# Patient Record
Sex: Male | Born: 1975 | Race: Black or African American | Hispanic: No | Marital: Married | Smoking: Current every day smoker
Health system: Southern US, Community
[De-identification: ages and names within clinical notes are randomized; demographics above are authoritative.]

## PROBLEM LIST (undated history)

## (undated) DIAGNOSIS — G43909 Migraine, unspecified, not intractable, without status migrainosus: Secondary | ICD-10-CM

## (undated) DIAGNOSIS — T7840XA Allergy, unspecified, initial encounter: Secondary | ICD-10-CM

## (undated) DIAGNOSIS — H269 Unspecified cataract: Secondary | ICD-10-CM

## (undated) HISTORY — DX: Unspecified cataract: H26.9

## (undated) HISTORY — DX: Allergy, unspecified, initial encounter: T78.40XA

---

## 1999-05-25 ENCOUNTER — Emergency Department (HOSPITAL_COMMUNITY): Admission: EM | Admit: 1999-05-25 | Discharge: 1999-05-25 | Payer: Self-pay | Admitting: Emergency Medicine

## 1999-05-25 ENCOUNTER — Encounter: Payer: Self-pay | Admitting: Emergency Medicine

## 2001-01-06 ENCOUNTER — Emergency Department (HOSPITAL_COMMUNITY): Admission: EM | Admit: 2001-01-06 | Discharge: 2001-01-06 | Payer: Self-pay | Admitting: Emergency Medicine

## 2007-03-07 ENCOUNTER — Emergency Department (HOSPITAL_COMMUNITY): Admission: EM | Admit: 2007-03-07 | Discharge: 2007-03-07 | Payer: Self-pay | Admitting: Emergency Medicine

## 2009-03-22 ENCOUNTER — Emergency Department (HOSPITAL_COMMUNITY): Admission: EM | Admit: 2009-03-22 | Discharge: 2009-03-22 | Payer: Self-pay | Admitting: Emergency Medicine

## 2012-07-26 ENCOUNTER — Encounter (HOSPITAL_BASED_OUTPATIENT_CLINIC_OR_DEPARTMENT_OTHER): Payer: Self-pay

## 2012-07-26 ENCOUNTER — Emergency Department (HOSPITAL_BASED_OUTPATIENT_CLINIC_OR_DEPARTMENT_OTHER)
Admission: EM | Admit: 2012-07-26 | Discharge: 2012-07-26 | Disposition: A | Discharge status: Home / Self Care | Payer: BC Managed Care – PPO | Attending: Emergency Medicine | Admitting: Emergency Medicine

## 2012-07-26 DIAGNOSIS — G43909 Migraine, unspecified, not intractable, without status migrainosus: Secondary | ICD-10-CM | POA: Insufficient documentation

## 2012-07-26 HISTORY — DX: Migraine, unspecified, not intractable, without status migrainosus: G43.909

## 2012-07-26 MED ORDER — METOCLOPRAMIDE HCL 5 MG/ML IJ SOLN
10.0000 mg | Freq: Once | INTRAMUSCULAR | Status: AC
  Administered 2012-07-26: 10 mg via INTRAMUSCULAR
  Filled 2012-07-26: qty 2

## 2012-07-26 MED ORDER — KETOROLAC TROMETHAMINE 60 MG/2ML IM SOLN
60.0000 mg | Freq: Once | INTRAMUSCULAR | Status: AC
  Administered 2012-07-26: 60 mg via INTRAMUSCULAR
  Filled 2012-07-26: qty 2

## 2012-07-26 MED ORDER — DIPHENHYDRAMINE HCL 50 MG/ML IJ SOLN
25.0000 mg | Freq: Once | INTRAMUSCULAR | Status: AC
  Administered 2012-07-26: 25 mg via INTRAMUSCULAR
  Filled 2012-07-26: qty 1

## 2012-07-26 MED ORDER — TRAMADOL HCL 50 MG PO TABS: 50.0000 mg | ORAL_TABLET | Freq: Four times a day (QID) | ORAL | Status: DC | PRN

## 2012-07-26 MED ORDER — SUMATRIPTAN SUCCINATE 100 MG PO TABS: 100.0000 mg | ORAL_TABLET | ORAL | Status: AC | PRN

## 2012-07-26 NOTE — ED Provider Notes (Signed)
History     CSN: [id number]  Arrival date & time 07/26/12  1546   First MD Initiated Contact with Patient 07/26/12 1600      Chief Complaint  Patient presents with  . Migraine    (Consider location/radiation/quality/duration/timing/severity/associated sxs/prior treatment) HPI Comments: Patient comes to the ER for evaluation of migraine headache. Patient has a previous history of migraines. He reports that he had achieved fairly good control previously with Topamax daily, but has been off of that for a couple of years. In the last few weeks has been having increased migraines. Patient has a throbbing headache that is consistent with previous migraines. No vomiting or diarrhea. Has some light sensitivity. No unusual features compared to previous migraines. Patient reports that he has followup with his doctor in one week.  Patient is a 37 y.o. male presenting with migraines.  Migraine Associated symptoms include headaches.    Past Medical History  Diagnosis Date  . Migraine     History reviewed. No pertinent past surgical history.  No family history on file.  History  Substance Use Topics  . Smoking status: Never Smoker   . Smokeless tobacco: Not on file  . Alcohol Use: No      Review of Systems  Neurological: Positive for headaches.  All other systems reviewed and are negative.    Allergies  Ibuprofen  Home Medications   Current Outpatient Rx  Name  Route  Sig  Dispense  Refill  . aspirin-acetaminophen-caffeine (EXCEDRIN MIGRAINE) 250-250-65 MG per tablet   Oral   Take 1 tablet by mouth every 6 (six) hours as needed for pain.           BP 140/73  Pulse 78  Temp(Src) 98.3 F (36.8 C) (Oral)  Resp 16  Ht 5\' 11"  (1.803 m)  Wt 210 lb (95.255 kg)  BMI 29.3 kg/m2  SpO2 100%  Physical Exam  Constitutional: He is oriented to person, place, and time. He appears well-developed and well-nourished. No distress.  HENT:  Head: Normocephalic and atraumatic.   Right Ear: Hearing normal.  Nose: Nose normal.  Mouth/Throat: Oropharynx is clear and moist and mucous membranes are normal.  Eyes: Conjunctivae and EOM are normal. Pupils are equal, round, and reactive to light.  Neck: Normal range of motion. Neck supple.  Cardiovascular: Normal rate, regular rhythm, S1 normal and S2 normal.  Exam reveals no gallop and no friction rub.   No murmur heard. Pulmonary/Chest: Effort normal and breath sounds normal. No respiratory distress. He exhibits no tenderness.  Abdominal: Soft. Normal appearance and bowel sounds are normal. There is no hepatosplenomegaly. There is no tenderness. There is no rebound, no guarding, no tenderness at McBurney's point and negative Murphy's sign. No hernia.  Musculoskeletal: Normal range of motion.  Neurological: He is alert and oriented to person, place, and time. He has normal strength. No cranial nerve deficit or sensory deficit. Coordination normal. GCS eye subscore is 4. GCS verbal subscore is 5. GCS motor subscore is 6.  Reflex Scores:      Tricep reflexes are 2+ on the right side and 2+ on the left side.      Bicep reflexes are 2+ on the right side and 2+ on the left side.      Patellar reflexes are 2+ on the right side and 2+ on the left side. Skin: Skin is warm, dry and intact. No rash noted. No cyanosis.  Psychiatric: He has a normal mood and affect. His speech is normal  and behavior is normal. Thought content normal.    ED Course  Procedures (including critical care time)  Labs Reviewed - No data to display No results found.   Diagnosis: Migraine headache    MDM  Patient presents to the ER for evaluation of migraine headache. Patient has a history of frequent chronic migraines, although he did achieve fairly good control in the past. He is out of his medications and now is having increased frequency of typical migraine type headaches. He has a normal neurologic examination. He does not require any imaging or  workup for his headache at this time. Patient treated for typical migraine and already has followup scheduled.        Gilda Crease, MD 07/26/12 [phone]

## 2012-07-26 NOTE — ED Notes (Signed)
C/o migraine HA every day x 1-5 weeks

## 2012-07-26 NOTE — ED Notes (Signed)
MD at bedside. 

## 2012-07-28 ENCOUNTER — Encounter (HOSPITAL_BASED_OUTPATIENT_CLINIC_OR_DEPARTMENT_OTHER): Payer: Self-pay

## 2012-07-28 DIAGNOSIS — Z7982 Long term (current) use of aspirin: Secondary | ICD-10-CM | POA: Insufficient documentation

## 2012-07-28 DIAGNOSIS — Z79899 Other long term (current) drug therapy: Secondary | ICD-10-CM | POA: Insufficient documentation

## 2012-07-28 DIAGNOSIS — R42 Dizziness and giddiness: Secondary | ICD-10-CM | POA: Insufficient documentation

## 2012-07-28 DIAGNOSIS — G43909 Migraine, unspecified, not intractable, without status migrainosus: Secondary | ICD-10-CM | POA: Insufficient documentation

## 2012-07-28 DIAGNOSIS — R220 Localized swelling, mass and lump, head: Secondary | ICD-10-CM | POA: Insufficient documentation

## 2012-07-28 NOTE — ED Notes (Signed)
Patient reports that he has ongoing intermittent headaches since being seen here on Thursday for same. Reports that he has been using meds with some relief but headache returns. Pain worse on left side of face/head

## 2012-07-29 ENCOUNTER — Emergency Department (HOSPITAL_BASED_OUTPATIENT_CLINIC_OR_DEPARTMENT_OTHER)
Admission: EM | Admit: 2012-07-29 | Discharge: 2012-07-29 | Disposition: A | Discharge status: Home / Self Care | Payer: BC Managed Care – PPO | Attending: Emergency Medicine | Admitting: Emergency Medicine

## 2012-07-29 MED ORDER — IBUPROFEN 600 MG PO TABS: 600.0000 mg | ORAL_TABLET | Freq: Four times a day (QID) | ORAL | Status: DC | PRN

## 2012-07-29 MED ORDER — DIAZEPAM 2 MG PO TABS
2.0000 mg | ORAL_TABLET | Freq: Once | ORAL | Status: AC
  Administered 2012-07-29: 2 mg via ORAL
  Filled 2012-07-29: qty 1

## 2012-07-29 MED ORDER — DEXAMETHASONE SODIUM PHOSPHATE 10 MG/ML IJ SOLN
10.0000 mg | Freq: Once | INTRAMUSCULAR | Status: AC
  Administered 2012-07-29: 10 mg via INTRAMUSCULAR
  Filled 2012-07-29: qty 1

## 2012-07-29 MED ORDER — HYDROCODONE-ACETAMINOPHEN 5-325 MG PO TABS: 1.0000 | ORAL_TABLET | Freq: Four times a day (QID) | ORAL | Status: DC | PRN

## 2012-07-29 MED ORDER — IBUPROFEN 200 MG PO TABS
600.0000 mg | ORAL_TABLET | Freq: Once | ORAL | Status: AC
  Administered 2012-07-29: 600 mg via ORAL
  Filled 2012-07-29: qty 1

## 2012-07-29 MED ORDER — HYDROCODONE-ACETAMINOPHEN 5-325 MG PO TABS
2.0000 | ORAL_TABLET | Freq: Once | ORAL | Status: AC
  Administered 2012-07-29: 2 via ORAL
  Filled 2012-07-29: qty 2

## 2012-07-29 NOTE — ED Provider Notes (Signed)
History     CSN: [id number]  Arrival date & time 07/28/12  2258   First MD Initiated Contact with Patient 07/29/12 [phone]      Chief Complaint  Patient presents with  . Migraine    (Consider location/radiation/quality/duration/timing/severity/associated sxs/prior treatment) HPI Comments:  Patient comes to the ER for evaluation of migraine headache. Patient has a previous history of migraines. He reports that he had achieved fairly good control previously with Topamax daily, but has been off of that for a couple of years. In the last few weeks has been having increased migraines. Patient has a throbbing headache that is consistent with previous migraines. No vomiting or diarrhea. Has some light sensitivity. No unusual features compared to previous migraines Patient was seen in the ED just few days back, and states that his headache responds for a little bit, and then just returns. He has a headache clinic visit on Tuesday. No nausea, vomiting, visual complains, seizures, altered mental status, loss of consciousness, new weakness, or numbness, no gait instability.  Patient is a 37 y.o. male presenting with migraines. The history is provided by the patient.  Migraine Associated symptoms include headaches. Pertinent negatives include no chest pain and no shortness of breath.    Past Medical History  Diagnosis Date  . Migraine     History reviewed. No pertinent past surgical history.  No family history on file.  History  Substance Use Topics  . Smoking status: Never Smoker   . Smokeless tobacco: Not on file  . Alcohol Use: No      Review of Systems  Constitutional: Negative for fever, chills and activity change.  HENT: Negative for neck pain.   Eyes: Negative for visual disturbance.  Respiratory: Negative for cough, chest tightness and shortness of breath.   Cardiovascular: Negative for chest pain.  Gastrointestinal: Negative for abdominal distention.  Genitourinary: Negative  for dysuria, enuresis and difficulty urinating.  Musculoskeletal: Negative for arthralgias.  Neurological: Positive for dizziness and headaches. Negative for light-headedness.  Psychiatric/Behavioral: Negative for confusion.    Allergies  Ibuprofen  Home Medications   Current Outpatient Rx  Name  Route  Sig  Dispense  Refill  . SUMAtriptan (IMITREX) 100 MG tablet   Oral   Take 1 tablet (100 mg total) by mouth every 2 (two) hours as needed for migraine (Do not take more than 2 tablets per 24hour period).   10 tablet   0   . traMADol (ULTRAM) 50 MG tablet   Oral   Take 1 tablet (50 mg total) by mouth every 6 (six) hours as needed for pain.   15 tablet   0   . aspirin-acetaminophen-caffeine (EXCEDRIN MIGRAINE) 250-250-65 MG per tablet   Oral   Take 1 tablet by mouth every 6 (six) hours as needed for pain.         Marland Kitchen HYDROcodone-acetaminophen (NORCO/VICODIN) 5-325 MG per tablet   Oral   Take 1 tablet by mouth every 6 (six) hours as needed for pain (for severe headaches only).   10 tablet   0   . ibuprofen (ADVIL,MOTRIN) 600 MG tablet   Oral   Take 1 tablet (600 mg total) by mouth every 6 (six) hours as needed for pain.   30 tablet   0     BP 120/77  Pulse 72  Temp(Src) 99.2 F (37.3 C) (Oral)  Resp 20  SpO2 99%  Physical Exam  Nursing note and vitals reviewed. Constitutional: He is oriented to person, place,  and time. He appears well-developed.  HENT:  Head: Normocephalic and atraumatic.  Mild right sided facial swelling ,with normal oral and ear exam, no trismus  Eyes: Conjunctivae and EOM are normal. Pupils are equal, round, and reactive to light.  Neck: Normal range of motion. Neck supple.  Cardiovascular: Normal rate and regular rhythm.   Pulmonary/Chest: Effort normal and breath sounds normal.  Abdominal: Soft. Bowel sounds are normal. He exhibits no distension. There is no tenderness. There is no rebound and no guarding.  Neurological: He is alert and  oriented to person, place, and time. No cranial nerve deficit. Coordination normal.  Skin: Skin is warm.    ED Course  Procedures (including critical care time)  Labs Reviewed - No data to display No results found.   1. Migraine syndrome       MDM  DDX includes: Primary headaches - including migrainous headaches, cluster headaches, tension headaches. ICH Carotid dissection Cavernous sinus thrombosis Meningitis Encephalitis Sinusitis Tumor Vascular headaches AV malformation Brain aneurysm Muscular headaches  A/P: Pt comes in with cc of headaches. No concerns for life threatening secondary headaches because of chronicity of the headaches, no meningeal signs, andno focal neurologic deficits.  Pt has been in the ED waiting for 5 hours, and pain has improved - so we gave po meds only, and added decadron. Pt's pain went down to 2/10 - and we d/c him. I requested him to continue with the imitrrex - and we are adding motrin to the regimen.  Tramadol is not helping, so instead we are giving 6 tabs of norco.   Derwood Kaplan, MD 07/29/12 [phone]

## 2014-09-10 ENCOUNTER — Encounter (HOSPITAL_COMMUNITY): Payer: Self-pay | Admitting: Emergency Medicine

## 2014-09-10 ENCOUNTER — Emergency Department (HOSPITAL_COMMUNITY)
Admission: EM | Admit: 2014-09-10 | Discharge: 2014-09-10 | Disposition: A | Discharge status: Home / Self Care | Payer: BLUE CROSS/BLUE SHIELD | Attending: Emergency Medicine | Admitting: Emergency Medicine

## 2014-09-10 DIAGNOSIS — R202 Paresthesia of skin: Secondary | ICD-10-CM | POA: Diagnosis not present

## 2014-09-10 DIAGNOSIS — J0110 Acute frontal sinusitis, unspecified: Secondary | ICD-10-CM | POA: Insufficient documentation

## 2014-09-10 DIAGNOSIS — R519 Headache, unspecified: Secondary | ICD-10-CM

## 2014-09-10 DIAGNOSIS — G43909 Migraine, unspecified, not intractable, without status migrainosus: Secondary | ICD-10-CM | POA: Insufficient documentation

## 2014-09-10 DIAGNOSIS — R51 Headache: Secondary | ICD-10-CM | POA: Diagnosis present

## 2014-09-10 MED ORDER — NAPROXEN 500 MG PO TABS: 500.0000 mg | ORAL_TABLET | Freq: Two times a day (BID) | ORAL | Status: DC

## 2014-09-10 MED ORDER — OXYMETAZOLINE HCL 0.05 % NA SOLN: 1.0000 | Freq: Two times a day (BID) | NASAL | Status: DC

## 2014-09-10 MED ORDER — NAPROXEN 250 MG PO TABS
500.0000 mg | ORAL_TABLET | Freq: Once | ORAL | Status: AC
  Administered 2014-09-10: 500 mg via ORAL
  Filled 2014-09-10: qty 2

## 2014-09-10 MED ORDER — AZITHROMYCIN 250 MG PO TABS: 250.0000 mg | ORAL_TABLET | Freq: Every day | ORAL | Status: DC

## 2014-09-10 MED ORDER — CETIRIZINE HCL 10 MG PO TABS: 10.0000 mg | ORAL_TABLET | Freq: Every day | ORAL | Status: DC

## 2014-09-10 NOTE — ED Provider Notes (Signed)
CSN: [id number]     Arrival date & time 09/10/14  2116 History   First MD Initiated Contact with Patient 09/10/14 2219     Chief Complaint  Patient presents with  . Headache  . Tingling     (Consider location/radiation/quality/duration/timing/severity/associated sxs/prior Treatment) HPI Comments: The patient is a 39 year old male who presents with a complaint of a frontal headache that started around 6:00 this evening, this was 4-1/2 hours ago, it was gradual in onset, it has been persistent, mild to moderate, associated with tingling in both upper extremities which is mild and intermittent. He denies weakness, there is no symptoms in the legs, denies fevers chills nausea vomiting photophobia cough sinus pain or tenderness nasal drainage or congestion. This does not feel like his typical headaches, he has generalized fatigue as well as evening. He denies stiffness of the neck loss of consciousness blurred vision or double vision.  Patient is a 39 y.o. male presenting with headaches. The history is provided by the patient.  Headache   Past Medical History  Diagnosis Date  . Migraine    History reviewed. No pertinent past surgical history. No family history on file. History  Substance Use Topics  . Smoking status: Never Smoker   . Smokeless tobacco: Not on file  . Alcohol Use: No    Review of Systems  Neurological: Positive for headaches.  All other systems reviewed and are negative.     Allergies  Ibuprofen  Home Medications   Prior to Admission medications   Medication Sig Start Date End Date Taking? Authorizing Provider  aspirin-acetaminophen-caffeine (EXCEDRIN MIGRAINE) [phone] MG per tablet Take 1 tablet by mouth every 6 (six) hours as needed for pain.    Historical Provider, MD  azithromycin (ZITHROMAX) 250 MG tablet Take 1 tablet (250 mg total) by mouth daily. Take first 2 tablets together, then 1 every day until finished. 09/10/14   Eber Hong, MD  cetirizine  (ZYRTEC ALLERGY) 10 MG tablet Take 1 tablet (10 mg total) by mouth daily. 09/10/14   Eber Hong, MD  HYDROcodone-acetaminophen (NORCO/VICODIN) 5-325 MG per tablet Take 1 tablet by mouth every 6 (six) hours as needed for pain (for severe headaches only). 07/29/12   Derwood Kaplan, MD  ibuprofen (ADVIL,MOTRIN) 600 MG tablet Take 1 tablet (600 mg total) by mouth every 6 (six) hours as needed for pain. 07/29/12   Derwood Kaplan, MD  naproxen (NAPROSYN) 500 MG tablet Take 1 tablet (500 mg total) by mouth 2 (two) times daily with a meal. 09/10/14   Eber Hong, MD  oxymetazoline (AFRIN NASAL SPRAY) 0.05 % nasal spray Place 1 spray into both nostrils 2 (two) times daily. 09/10/14   Eber Hong, MD  SUMAtriptan (IMITREX) 100 MG tablet Take 1 tablet (100 mg total) by mouth every 2 (two) hours as needed for migraine (Do not take more than 2 tablets per 24hour period). 07/26/12   Gilda Crease, MD  traMADol (ULTRAM) 50 MG tablet Take 1 tablet (50 mg total) by mouth every 6 (six) hours as needed for pain. 07/26/12   Gilda Crease, MD   BP 111/73 mmHg  Pulse 94  Temp(Src) 100.4 F (38 C) (Oral)  Resp 14  Ht  (1.803 m)  Wt 215 lb (97.523 kg)  BMI 30.00 kg/m2  SpO2 98% Physical Exam  Constitutional: He appears well-developed and well-nourished. No distress.  HENT:  Head: Normocephalic and atraumatic.  Mouth/Throat: Oropharynx is clear and moist. No oropharyngeal exudate.  Swollen nasal turbinates bilaterally,  thick mucoid discharge in the bilateral nares, oropharynx clear and moist, no sinus tenderness, tympanic membranes visualized, mild erythema on the left, normal on the right  Eyes: Conjunctivae and EOM are normal. Pupils are equal, round, and reactive to light. Right eye exhibits no discharge. Left eye exhibits no discharge. No scleral icterus.  Neck: Normal range of motion. Neck supple. No JVD present. No thyromegaly present.  Cardiovascular: Normal rate, regular rhythm, normal heart  sounds and intact distal pulses.  Exam reveals no gallop and no friction rub.   No murmur heard. Pulmonary/Chest: Effort normal and breath sounds normal. No respiratory distress. He has no wheezes. He has no rales.  Abdominal: Soft. Bowel sounds are normal. He exhibits no distension and no mass. There is no tenderness.  Musculoskeletal: Normal range of motion. He exhibits no edema or tenderness.  Lymphadenopathy:    He has no cervical adenopathy.  Neurological: He is alert. Coordination normal.  Skin: Skin is warm and dry. No rash noted. No erythema.  Psychiatric: He has a normal mood and affect. His behavior is normal.  Nursing note and vitals reviewed.   ED Course  Procedures (including critical care time) Labs Review Labs Reviewed - No data to display  Imaging Review No results found.   MDM   Final diagnoses:  Acute frontal sinusitis, recurrence not specified  Sinus headache    Vital signs show low-grade fever at 100.4, no tachycardia, no stiffness of the neck, no lymphadenopathy, likely has a sinusitis, treated with medications as below, patient is aware of the indications for return and is in agreement.   Meds given in ED:  Medications  naproxen (NAPROSYN) tablet 500 mg (500 mg Oral Given 09/10/14 2247)    Discharge Medication List as of 09/10/2014 10:32 PM    START taking these medications   Details  azithromycin (ZITHROMAX) 250 MG tablet Take 1 tablet (250 mg total) by mouth daily. Take first 2 tablets together, then 1 every day until finished., Starting 09/10/2014, Until Discontinued, Print    cetirizine (ZYRTEC ALLERGY) 10 MG tablet Take 1 tablet (10 mg total) by mouth daily., Starting 09/10/2014, Until Discontinued, Print    naproxen (NAPROSYN) 500 MG tablet Take 1 tablet (500 mg total) by mouth 2 (two) times daily with a meal., Starting 09/10/2014, Until Discontinued, Print    oxymetazoline (AFRIN NASAL SPRAY) 0.05 % nasal spray Place 1 spray into both nostrils 2  (two) times daily., Starting 09/10/2014, Until Discontinued, Print            Eber HongBrian Markavious Micco, MD 09/11/14 [phone]

## 2014-09-10 NOTE — Discharge Instructions (Signed)
Please call your doctor for a followup appointment within 24-48 hours. When you talk to your doctor please let them know that you were seen in the emergency department and have them acquire all of your records so that they can discuss the findings with you and formulate a treatment plan to fully care for your new and ongoing problems. ° °

## 2014-09-10 NOTE — ED Notes (Signed)
Pt reports frontal headache that started at 6pm with tingling down both arms. Grip strengths equal. sts he feels sluggish, also c/o lightheaded. Hx of cluster headaches but sts this is different.

## 2016-09-23 ENCOUNTER — Emergency Department (HOSPITAL_COMMUNITY): Payer: BLUE CROSS/BLUE SHIELD

## 2016-09-23 ENCOUNTER — Emergency Department (HOSPITAL_COMMUNITY)
Admission: EM | Admit: 2016-09-23 | Discharge: 2016-09-23 | Disposition: A | Discharge status: Home / Self Care | Payer: BLUE CROSS/BLUE SHIELD | Attending: Emergency Medicine | Admitting: Emergency Medicine

## 2016-09-23 ENCOUNTER — Encounter (HOSPITAL_COMMUNITY): Payer: Self-pay | Admitting: *Deleted

## 2016-09-23 ENCOUNTER — Other Ambulatory Visit: Payer: Self-pay

## 2016-09-23 DIAGNOSIS — Z79899 Other long term (current) drug therapy: Secondary | ICD-10-CM | POA: Diagnosis not present

## 2016-09-23 DIAGNOSIS — R0789 Other chest pain: Secondary | ICD-10-CM | POA: Diagnosis not present

## 2016-09-23 LAB — CBC
HEMATOCRIT: 51.2 % (ref 39.0–52.0)
HEMOGLOBIN: 17.3 g/dL — AB (ref 13.0–17.0)
MCH: 29.5 pg (ref 26.0–34.0)
MCHC: 33.8 g/dL (ref 30.0–36.0)
MCV: 87.2 fL (ref 78.0–100.0)
PLATELETS: 263 10*3/uL (ref 150–400)
RBC: 5.87 MIL/uL — AB (ref 4.22–5.81)
RDW: 14.7 % (ref 11.5–15.5)
WBC: 4.9 10*3/uL (ref 4.0–10.5)

## 2016-09-23 LAB — BASIC METABOLIC PANEL
ANION GAP: 7 (ref 5–15)
BUN: 10 mg/dL (ref 6–20)
CALCIUM: 9.3 mg/dL (ref 8.9–10.3)
CO2: 26 mmol/L (ref 22–32)
Chloride: 106 mmol/L (ref 101–111)
Creatinine, Ser: 1.33 mg/dL — ABNORMAL HIGH (ref 0.61–1.24)
GFR calc non Af Amer: >60 mL/min (ref >60)
Glucose, Bld: 116 mg/dL — ABNORMAL HIGH (ref 65–99)
POTASSIUM: 4 mmol/L (ref 3.5–5.1)
Sodium: 139 mmol/L (ref 135–145)

## 2016-09-23 LAB — I-STAT TROPONIN, ED: Troponin i, poc: 0 ng/mL (ref 0.00–0.08)

## 2016-09-23 MED ORDER — PREDNISONE 20 MG PO TABS
60.0000 mg | ORAL_TABLET | ORAL | Status: AC
  Administered 2016-09-23: 60 mg via ORAL
  Filled 2016-09-23: qty 3

## 2016-09-23 MED ORDER — PREDNISONE 20 MG PO TABS: 40.0000 mg | ORAL_TABLET | Freq: Every day | ORAL | 0 refills | Status: AC

## 2016-09-23 NOTE — Discharge Instructions (Signed)
As discussed, your evaluation today has been largely reassuring.  But, it is important that you monitor your condition carefully, and do not hesitate to return to the ED if you develop new, or concerning changes in your condition. ? ?Otherwise, please follow-up with your physician for appropriate ongoing care. ? ?

## 2016-09-23 NOTE — ED Triage Notes (Signed)
Pt reports onset last night of mid chest pain, increases with movement. Denies recent cough or sob. No acute distress is noted at triage and ekg done.

## 2016-09-23 NOTE — ED Notes (Signed)
Returned from radiology. 

## 2016-09-23 NOTE — ED Provider Notes (Signed)
[facility]-EMERGENCY DEPT Provider Note   CSN: [id number] Arrival date & time: 09/23/16  1121     History   Chief Complaint Chief Complaint  Patient presents with  . Chest Pain    HPI John Webb is a 41 y.o. male.  HPI Patient presents with chest pain. The pain is sternal, sore, uncomfortable, worse with motion, though unpredictably so. No new dyspnea, fever, cough. Patient does have history of migraines, notes that he had a typical migraine earlier today, but this has improved following a dose of sumatriptan. Beyond the headache, no other notable health changes. Patient is actively in an exercise program, exercises daily, notes that he had a recent break in his program, but recently resumed working out. Per Today, with ongoing pain, discomfort, and no clear alleviating factors, the patient presents for evaluation.  Past Medical History:  Diagnosis Date  . Migraine     There are no active problems to display for this patient.   History reviewed. No pertinent surgical history.     Home Medications    Prior to Admission medications   Medication Sig Start Date End Date Taking? Authorizing Provider  SUMAtriptan 6 MG/0.5ML SOAJ Inject 0.5 mLs into the skin as needed. May repeat in 2 hours. MAX 2/24hours 08/23/16 08/23/17 Yes Historical Provider, MD  tetrahydrozoline-zinc (VISINE-AC) 0.05-0.25 % ophthalmic solution Place 1 drop into both eyes daily.   Yes Historical Provider, MD  verapamil (CALAN) 120 MG tablet Take 120 mg by mouth 3 (three) times daily. 08/10/16 08/10/17 Yes Historical Provider, MD  cetirizine (ZYRTEC ALLERGY) 10 MG tablet Take 1 tablet (10 mg total) by mouth daily. Patient not taking: Reported on 09/23/2016 09/10/14   Eber Hong, MD  HYDROcodone-acetaminophen (NORCO/VICODIN) 5-325 MG per tablet Take 1 tablet by mouth every 6 (six) hours as needed for pain (for severe headaches only). Patient not taking: Reported on 09/23/2016 07/29/12   Derwood Kaplan, MD  ibuprofen  (ADVIL,MOTRIN) 600 MG tablet Take 1 tablet (600 mg total) by mouth every 6 (six) hours as needed for pain. Patient not taking: Reported on 09/23/2016 07/29/12   Derwood Kaplan, MD  naproxen (NAPROSYN) 500 MG tablet Take 1 tablet (500 mg total) by mouth 2 (two) times daily with a meal. Patient not taking: Reported on 09/23/2016 09/10/14   Eber Hong, MD  oxymetazoline [facility] NASAL SPRAY) 0.05 % nasal spray Place 1 spray into both nostrils 2 (two) times daily. Patient not taking: Reported on 09/23/2016 09/10/14   Eber Hong, MD  SUMAtriptan (IMITREX) 100 MG tablet Take 1 tablet (100 mg total) by mouth every 2 (two) hours as needed for migraine (Do not take more than 2 tablets per 24hour period). Patient not taking: Reported on 09/23/2016 07/26/12   Gilda Crease, MD  traMADol (ULTRAM) 50 MG tablet Take 1 tablet (50 mg total) by mouth every 6 (six) hours as needed for pain. Patient not taking: Reported on 09/23/2016 07/26/12   Gilda Crease, MD    Family History History reviewed. No pertinent family history.  Social History Social History  Substance Use Topics  . Smoking status: Never Smoker  . Smokeless tobacco: Not on file  . Alcohol use No     Allergies   Ibuprofen   Review of Systems Review of Systems  Constitutional:       Per HPI, otherwise negative  HENT:       Per HPI, otherwise negative  Respiratory:       Per HPI, otherwise negative  Cardiovascular:  Per HPI, otherwise negative  Gastrointestinal: Negative for vomiting.  Endocrine:       Negative aside from HPI  Genitourinary:       Neg aside from HPI   Musculoskeletal:       Per HPI, otherwise negative  Skin: Negative.   Neurological: Negative for syncope.     Physical Exam Updated Vital Signs BP 121/79   Pulse 70   Temp 99 F (37.2 C)   Resp 16   Ht 5\' 11"  (1.803 m)   Wt 195 lb (88.5 kg)   SpO2 96%   BMI 27.20 kg/m   Physical Exam  Constitutional: He is oriented to person, place, and  time. He appears well-developed. No distress.  HENT:  Head: Normocephalic and atraumatic.  Eyes: Conjunctivae and EOM are normal.  Cardiovascular: Normal rate and regular rhythm.   Pulmonary/Chest: Effort normal. No stridor. No respiratory distress.    Abdominal: He exhibits no distension.  Musculoskeletal: He exhibits no edema.  Neurological: He is alert and oriented to person, place, and time.  Skin: Skin is warm and dry.  Psychiatric: He has a normal mood and affect.  Nursing note and vitals reviewed.    ED Treatments / Results  Labs (all labs ordered are listed, but only abnormal results are displayed) Labs Reviewed  BASIC METABOLIC PANEL - Abnormal; Notable for the following:       Result Value   Glucose, Bld 116 (*)    Creatinine, Ser 1.33 (*)    All other components within normal limits  CBC - Abnormal; Notable for the following:    RBC 5.87 (*)    Hemoglobin 17.3 (*)    All other components within normal limits  I-STAT TROPOININ, ED    EKG EKG has sinus rhythm, rate 81, unremarkable Radiology Dg Chest 2 View  Result Date: 09/23/2016 CLINICAL DATA:  Chest pain for 1 day. EXAM: CHEST  2 VIEW COMPARISON:  None. FINDINGS: The lungs are clear. Heart size is normal. No pneumothorax or pleural effusion. No bony abnormality. IMPRESSION: Negative chest. Electronically Signed   By: Drusilla Kannerhomas  Dalessio M.D.   On: 09/23/2016 12:16    Procedures Procedures (including critical care time)  Medications Ordered in ED Medications  predniSONE (DELTASONE) tablet 60 mg (not administered)     Initial Impression / Assessment and Plan / ED Course  I have reviewed the triage vital signs and the nursing notes.  Pertinent labs & imaging results that were available during my care of the patient were reviewed by me and considered in my medical decision making (see chart for details).  Well-appearing young male presents with ongoing chest pain. Patient has minimal risk profile for ACS,  has reassuring EKG, labs. Patient's physical exam, with tenderness to palpation of the sternum, and pain with range of motion of the shoulder suggests inflammatory etiology. Patient is not a candidate for NSAID therapy given his headaches, history of precipitation of severe headaches with this medication. Patient started on a course of steroids, discharged in stable condition with inflammatory chest pain   Final Clinical Impressions(s) / ED Diagnoses  Atypical chest pain   Gerhard Munchobert Alaijah Gibler, MD 09/23/16 1315

## 2017-06-18 DIAGNOSIS — H1013 Acute atopic conjunctivitis, bilateral: Secondary | ICD-10-CM | POA: Diagnosis not present

## 2017-07-06 DIAGNOSIS — H18213 Corneal edema secondary to contact lens, bilateral: Secondary | ICD-10-CM | POA: Diagnosis not present

## 2017-07-06 DIAGNOSIS — H16143 Punctate keratitis, bilateral: Secondary | ICD-10-CM | POA: Diagnosis not present

## 2017-07-06 DIAGNOSIS — H17821 Peripheral opacity of cornea, right eye: Secondary | ICD-10-CM | POA: Diagnosis not present

## 2017-08-14 DIAGNOSIS — G43009 Migraine without aura, not intractable, without status migrainosus: Secondary | ICD-10-CM | POA: Diagnosis not present

## 2017-08-14 DIAGNOSIS — F419 Anxiety disorder, unspecified: Secondary | ICD-10-CM | POA: Diagnosis not present

## 2017-08-14 DIAGNOSIS — G44009 Cluster headache syndrome, unspecified, not intractable: Secondary | ICD-10-CM | POA: Diagnosis not present

## 2017-12-19 IMAGING — DX DG CHEST 2V
2 series · 2 of 2 positions shown · non-contrast
Comparison: None.

CLINICAL DATA: Chest pain for 1 day.

EXAM:
CHEST  2 VIEW

[chest pa]
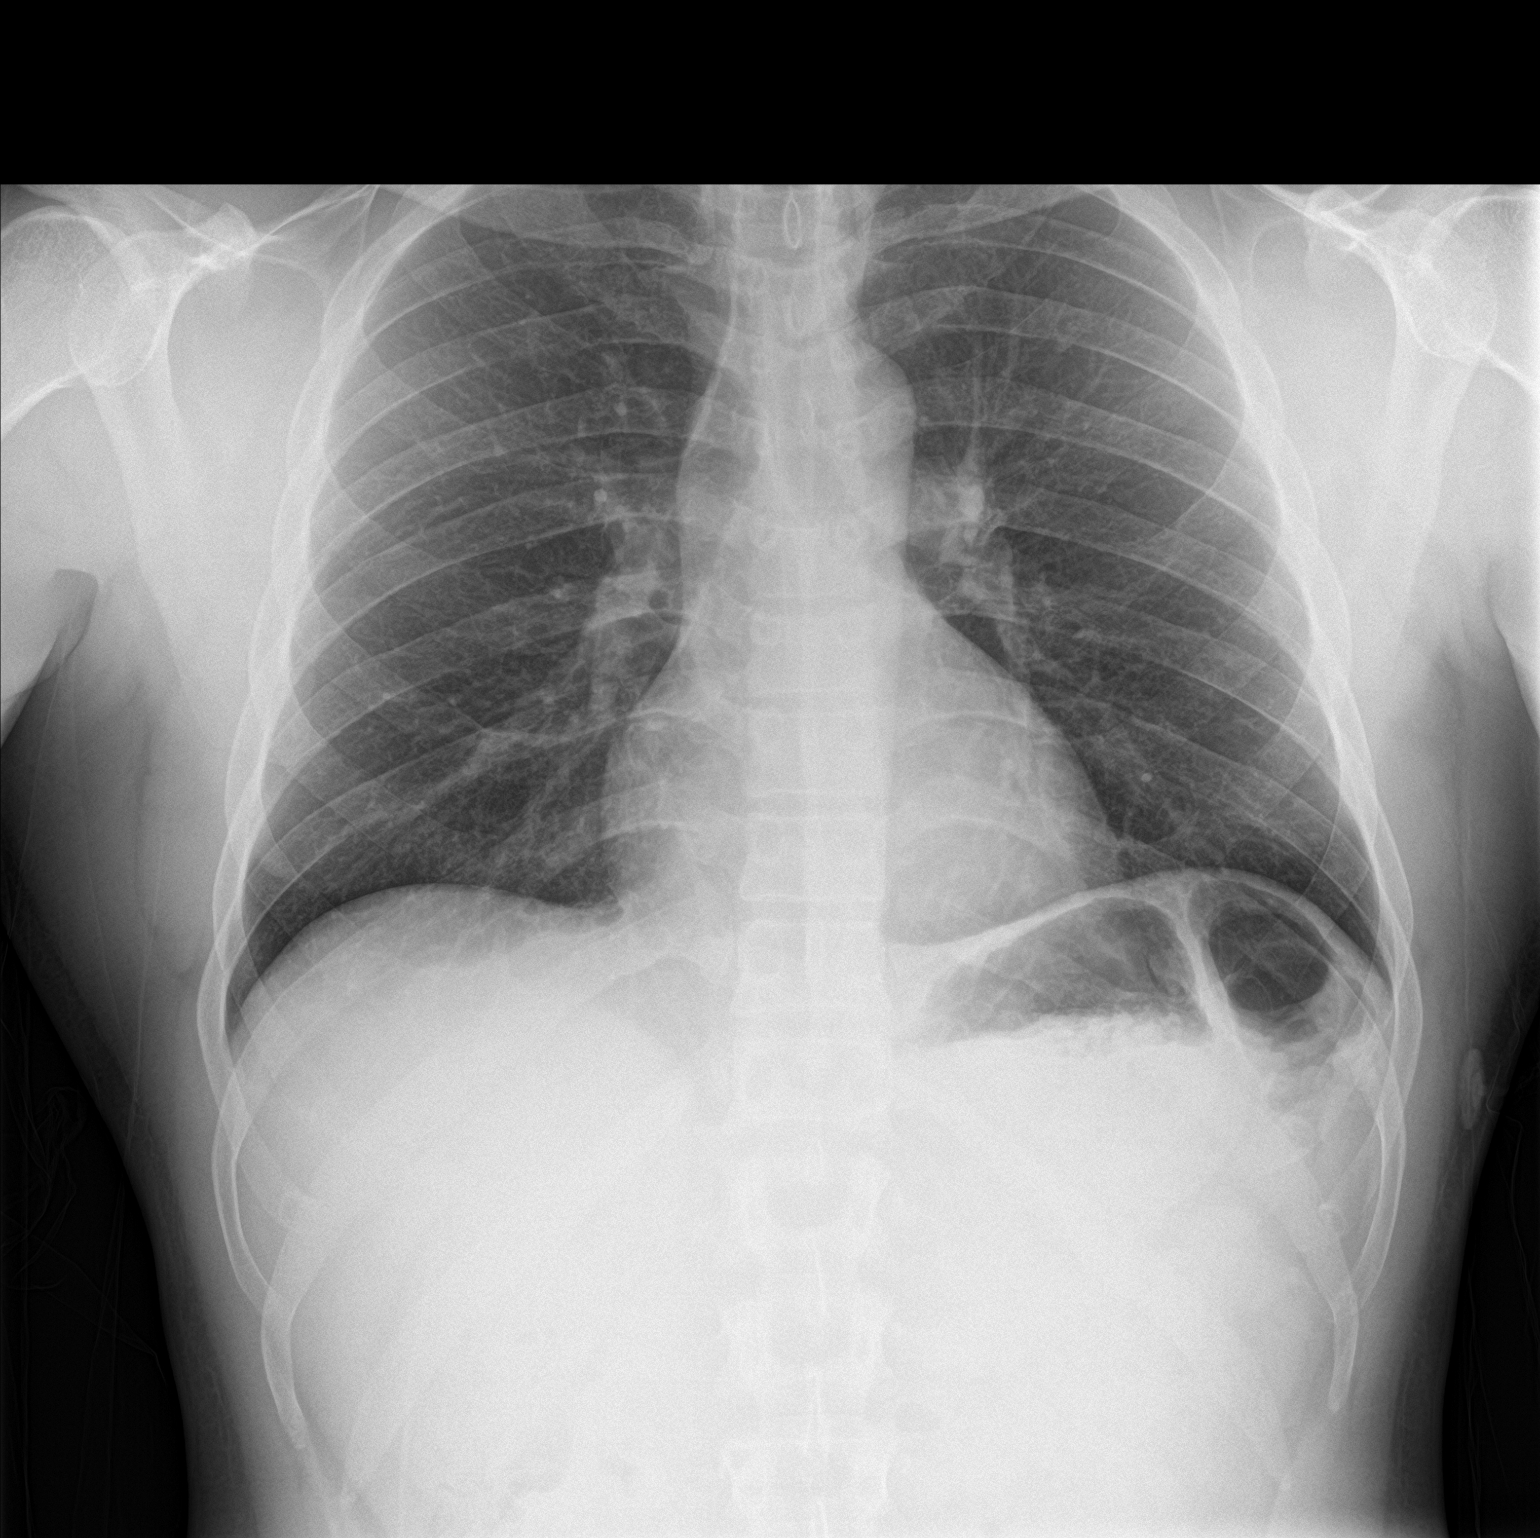

[chest lat]
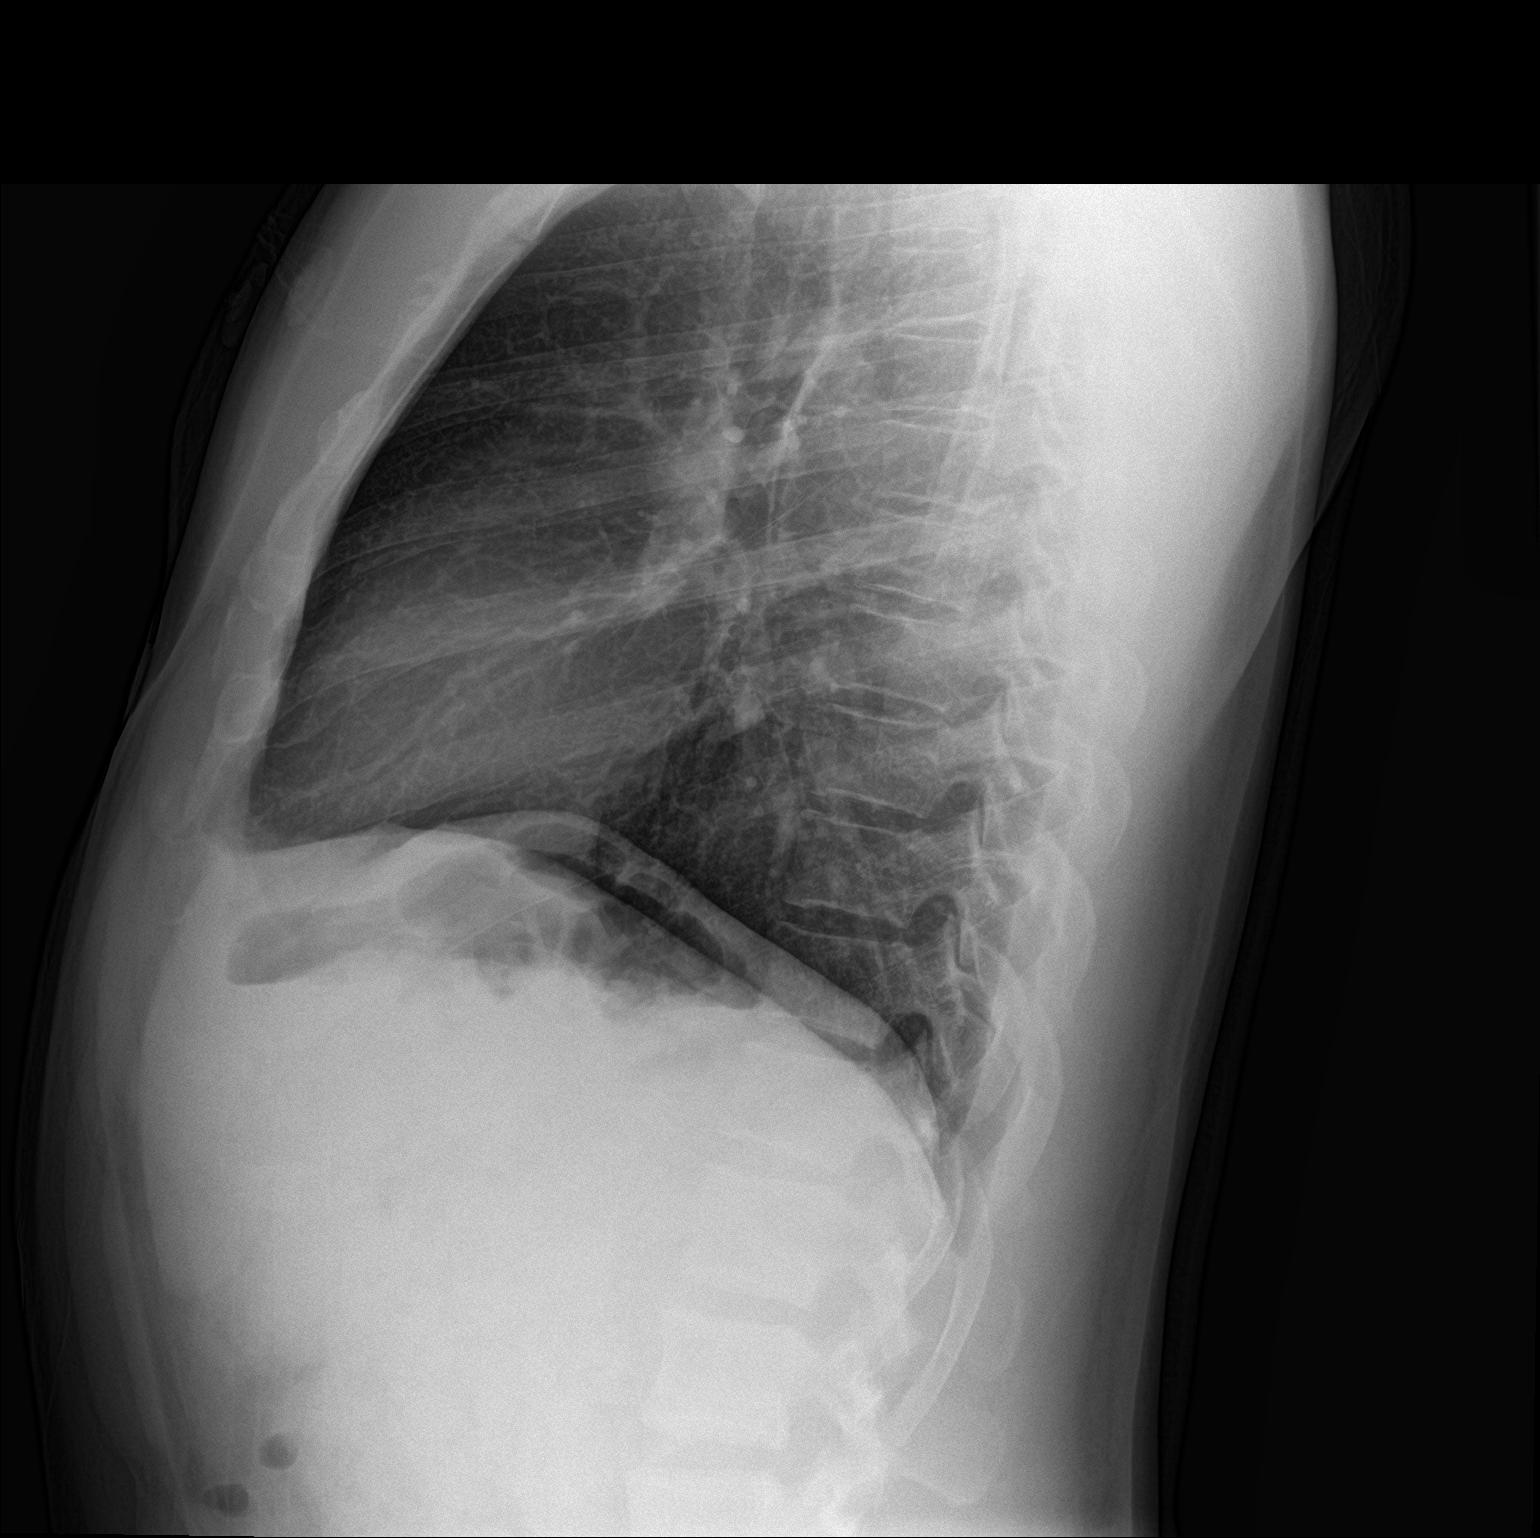

[2 of 2 positions shown; findings below may reference images not displayed]

FINDINGS: The lungs are clear. Heart size is normal. No pneumothorax or
pleural effusion. No bony abnormality.
IMPRESSION: Negative chest.

## 2018-01-02 DIAGNOSIS — M25571 Pain in right ankle and joints of right foot: Secondary | ICD-10-CM | POA: Diagnosis not present

## 2018-01-02 DIAGNOSIS — S93401A Sprain of unspecified ligament of right ankle, initial encounter: Secondary | ICD-10-CM | POA: Diagnosis not present

## 2018-03-12 DIAGNOSIS — G43009 Migraine without aura, not intractable, without status migrainosus: Secondary | ICD-10-CM | POA: Diagnosis not present

## 2018-03-12 DIAGNOSIS — G44009 Cluster headache syndrome, unspecified, not intractable: Secondary | ICD-10-CM | POA: Diagnosis not present

## 2019-05-06 ENCOUNTER — Encounter: Payer: Self-pay | Admitting: Family Medicine

## 2019-05-06 ENCOUNTER — Ambulatory Visit: Payer: BC Managed Care – PPO | Admitting: Family Medicine

## 2019-05-06 ENCOUNTER — Other Ambulatory Visit: Payer: Self-pay

## 2019-05-06 VITALS — BP 126/85 | HR 74 | Temp 98.3°F | Ht 70.0 in | Wt 192.2 lb

## 2019-05-06 DIAGNOSIS — Z130 Encounter for screening for diseases of the blood and blood-forming organs and certain disorders involving the immune mechanism: Secondary | ICD-10-CM | POA: Diagnosis not present

## 2019-05-06 DIAGNOSIS — Z0001 Encounter for general adult medical examination with abnormal findings: Secondary | ICD-10-CM | POA: Diagnosis not present

## 2019-05-06 DIAGNOSIS — Z0000 Encounter for general adult medical examination without abnormal findings: Secondary | ICD-10-CM

## 2019-05-06 DIAGNOSIS — Z1329 Encounter for screening for other suspected endocrine disorder: Secondary | ICD-10-CM

## 2019-05-06 DIAGNOSIS — Z13220 Encounter for screening for lipoid disorders: Secondary | ICD-10-CM

## 2019-05-06 DIAGNOSIS — Z131 Encounter for screening for diabetes mellitus: Secondary | ICD-10-CM

## 2019-05-06 DIAGNOSIS — F17290 Nicotine dependence, other tobacco product, uncomplicated: Secondary | ICD-10-CM

## 2019-05-06 NOTE — Patient Instructions (Addendum)
I'm glad to hear that headaches are doing ok. Keep follow up with headache specialist.   Let me know if any assistance needed to quit smoking.   1 year follow up physical unless any concerns sooner. Nice meeting you today and happy early birthday.   Keeping you healthy  Get these tests  Blood pressure- Have your blood pressure checked once a year by your healthcare provider.  Normal blood pressure is 120/80.  Weight- Have your body mass index (BMI) calculated to screen for obesity.  BMI is a measure of body fat based on height and weight. You can also calculate your own BMI at [email or web address]/.  Cholesterol- Have your cholesterol checked regularly starting at age 43, sooner may be necessary if you have diabetes, high blood pressure, if a family member developed heart diseases at an early age or if you smoke.   Chlamydia, HIV, and other sexual transmitted disease- Get screened each year until the age of 325 then within three months of each new sexual partner.  Diabetes- Have your blood sugar checked regularly if you have high blood pressure, high cholesterol, a family history of diabetes or if you are overweight.  Get these vaccines  Flu shot- Every fall.  Tetanus shot- Every 10 years.  Menactra- Single dose; prevents meningitis.  Take these steps  Don't smoke- If you do smoke, ask your healthcare provider about quitting. For tips on how to quit, go to www.smokefree.gov or call 1-800-QUIT-NOW.  Be physically active- Exercise 5 days a week for at least 30 minutes.  If you are not already physically active start slow and gradually work up to 30 minutes of moderate physical activity.  Examples of moderate activity include walking briskly, mowing the yard, dancing, swimming bicycling, etc.  Eat a healthy diet- Eat a variety of healthy foods such as fruits, vegetables, low fat milk, low fat cheese, yogurt, lean meats, poultry, fish, beans, tofu, etc.  For more information on  healthy eating, go to www.thenutritionsource.org  Drink alcohol in moderation- Limit alcohol intake two drinks or less a day.  Never drink and drive.  Dentist- Brush and floss teeth twice daily; visit your dentis twice a year.  Depression-Your emotional health is as important as your physical health.  If you're feeling down, losing interest in things you normally enjoy please talk with your healthcare provider.  Gun Safety- If you keep a gun in your home, keep it unloaded and with the safety lock on.  Bullets should be stored separately.  Helmet use- Always wear a helmet when riding a motorcycle, bicycle, rollerblading or skateboarding.  Safe sex- If you may be exposed to a sexually transmitted infection, use a condom  Seat belts- Seat bels can save your life; always wear one.  Smoke/Carbon Monoxide detectors- These detectors need to be installed on the appropriate level of your home.  Replace batteries at least once a year.  Skin Cancer- When out in the sun, cover up and use sunscreen SPF 15 or higher.  Violence- If anyone is threatening or hurting you, please tell your healthcare provider.  Steps to Quit Smoking Smoking tobacco is the leading cause of preventable death. It can affect almost every organ in the body. Smoking puts you and those around you at risk for developing many serious chronic diseases. Quitting smoking can be difficult, but it is one of the best things that you can do for your health. It is never too late to quit. How do I get ready to quit?  When you decide to quit smoking, create a plan to help you succeed. Before you quit:  Pick a date to quit. Set a date within the next 2 weeks to give you time to prepare.  Write down the reasons why you are quitting. Keep this list in places where you will see it often.  Tell your family, friends, and co-workers that you are quitting. Support from your loved ones can make quitting easier.  Talk with your health care provider  about your options for quitting smoking.  Find out what treatment options are covered by your health insurance.  Identify people, places, things, and activities that make you want to smoke (triggers). Avoid them. What first steps can I take to quit smoking?  Throw away all cigarettes at home, at work, and in your car.  Throw away smoking accessories, such as Set designer.  Clean your car. Make sure to empty the ashtray.  Clean your home, including curtains and carpets. What strategies can I use to quit smoking? Talk with your health care provider about combining strategies, such as taking medicines while you are also receiving in-person counseling. Using these two strategies together makes you more likely to succeed in quitting than if you used either strategy on its own.  If you are pregnant or breastfeeding, talk with your health care provider about finding counseling or other support strategies to quit smoking. Do not take medicine to help you quit smoking unless your health care provider tells you to do so. To quit smoking: Quit right away  Quit smoking completely, instead of gradually reducing how much you smoke over a period of time. Research shows that stopping smoking right away is more successful than gradually quitting.  Attend in-person counseling to help you build problem-solving skills. You are more likely to succeed in quitting if you attend counseling sessions regularly. Even short sessions of 10 minutes can be effective. Take medicine You may take medicines to help you quit smoking. Some medicines require a prescription and some you can purchase over-the-counter. Medicines may have nicotine in them to replace the nicotine in cigarettes. Medicines may:  Help to stop cravings.  Help to relieve withdrawal symptoms. Your health care provider may recommend:  Nicotine patches, gum, or lozenges.  Nicotine inhalers or sprays.  Non-nicotine medicine that is taken  by mouth. Find resources Find resources and support systems that can help you to quit smoking and remain smoke-free after you quit. These resources are most helpful when you use them often. They include:  Online chats with a Veterinary surgeon.  Telephone quitlines.  Printed Materials engineer.  Support groups or group counseling.  Text messaging programs.  Mobile phone apps or applications. Use apps that can help you stick to your quit plan by providing reminders, tips, and encouragement. There are many free apps for mobile devices as well as websites. Examples include Quit Guide from the [employer] and smokefree.gov What things can I do to make it easier to quit?   Reach out to your family and friends for support and encouragement. Call telephone quitlines (1-800-QUIT-NOW), reach out to support groups, or work with a counselor for support.  Ask people who smoke to avoid smoking around you.  Avoid places that trigger you to smoke, such as bars, parties, or smoke-break areas at work.  Spend time with people who do not smoke.  Lessen the stress in your life. Stress can be a smoking trigger for some people. To lessen stress, try: ? Exercising regularly. ?  Doing deep-breathing exercises. ? Doing yoga. ? Meditating. ? Performing a body scan. This involves closing your eyes, scanning your body from head to toe, and noticing which parts of your body are particularly tense. Try to relax the muscles in those areas. How will I feel when I quit smoking? Day 1 to 3 weeks Within the first 24 hours of quitting smoking, you may start to feel withdrawal symptoms. These symptoms are usually most noticeable 2-3 days after quitting, but they usually do not last for more than 2-3 weeks. You may experience these symptoms:  Mood swings.  Restlessness, anxiety, or irritability.  Trouble concentrating.  Dizziness.  Strong cravings for sugary foods and nicotine.  Mild weight  gain.  Constipation.  Nausea.  Coughing or a sore throat.  Changes in how the medicines that you take for unrelated issues work in your body.  Depression.  Trouble sleeping (insomnia). Week 3 and afterward After the first 2-3 weeks of quitting, you may start to notice more positive results, such as:  Improved sense of smell and taste.  Decreased coughing and sore throat.  Slower heart rate.  Lower blood pressure.  Clearer skin.  The ability to breathe more easily.  Fewer sick days. Quitting smoking can be very challenging. Do not get discouraged if you are not successful the first time. Some people need to make many attempts to quit before they achieve long-term success. Do your best to stick to your quit plan, and talk with your health care provider if you have any questions or concerns. Summary  Smoking tobacco is the leading cause of preventable death. Quitting smoking is one of the best things that you can do for your health.  When you decide to quit smoking, create a plan to help you succeed.  Quit smoking right away, not slowly over a period of time.  When you start quitting, seek help from your health care provider, family, or friends. This information is not intended to replace advice given to you by your health care provider. Make sure you discuss any questions you have with your health care provider. Document Released: 05/03/2001 Document Revised: 07/27/2018 Document Reviewed: 07/28/2018 Elsevier Patient Education  [employer].    If you have lab work done today you will be contacted with your lab results within the next 2 weeks.  If you have not heard from [place] then please contact us. The fastest way to get your results is to register for My Chart.   IF you received an x-ray today, you will receive an invoice from [facility]. Please contact [facility] at [phone] with questions or concerns regarding your invoice.   IF you  received labwork today, you will receive an invoice from [facility]. Please contact [facility] at [phone] with questions or concerns regarding your invoice.   Our billing staff will not be able to assist you with questions regarding bills from these companies.  You will be contacted with the lab results as soon as they are available. The fastest way to get your results is to activate your My Chart account. Instructions are located on the last page of this paperwork. If you have not heard from [place] regarding the results in 2 weeks, please contact this office.

## 2019-05-06 NOTE — Progress Notes (Signed)
Subjective:  Patient ID: John Webb, male    DOB: 1975/09/22  Age: 43 y.o. MRN: [id number]  CC:  Chief Complaint  Patient presents with  . Establish Care    HPI John Webb presents for  Establish care/physical. Wants to be proactive about health.   Headaches: Migraine, cluster headache by history.  Last treated for migraine October 2019 with [facility].  Note reviewed.  Initially with clusters in 2007.  Treated with oxygen, triptans, acetaminophen and NSAIDs previously.  Oxygen had helped, Tylenol or NSAIDs help with begin his cycle.  Family history of migraine headaches with mom, paternal cousin.  Thought to have migraines in between his cluster cycle.  Treated with Topamax which has been helpful per October 2019 notes.  Continued on Topamax, verapamil as needed for cluster cycle.  Prednisone taper and option of Emgality, as well as Imitrex pills for migraine, injections for cluster.  Plan for 46-month follow-up in April this year.  appt in February with HA specialist, off meds and HA free for 4 months. Has meds if needed.   Flu - declines flu vaccine Tetanus 3-4 yrs ago.   FH of HLD (mom), DM (dad).  Labs in care everywhere: 03/24/16: Creat 1.29 Normal glucose 98.  Tc 210 Trig 68 HDL:79 LDL 117  Lost 45 pounds over past 3 years - cut out fast food, exercise, and avoids late night meals, more water.  Fasting currently 6 hrs.  Married 17yrs, 43yo dtr at [employer]. Declined STI testing. 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   No exam data present Cataract noted this year - seen by optho yearly. Contact lenses.   Dental: every 6 months.Dr. Alvina Filbert  Exercise 4-5 times per week. 1-1.5 hrs.   Tobacco: black and mild 1 pack per week. Quit for 15 yrs, then restarted .   Alcohol: 1-2 drinks per week.   History There are no problems to display for this patient.  Past Medical History:  Diagnosis Date  . Allergy   . Cataract   . Migraine     History reviewed. No pertinent surgical history. Allergies  Allergen Reactions  . Ibuprofen Other (See Comments)    Triggers migraines   Prior to Admission medications   Medication Sig Start Date End Date Taking? Authorizing Provider  SUMAtriptan (IMITREX) 100 MG tablet Take 1 tablet (100 mg total) by mouth every 2 (two) hours as needed for migraine (Do not take more than 2 tablets per 24hour period). 07/26/12  Yes Pollina, Canary Brim, MD  SUMAtriptan 6 MG/0.5ML SOAJ Inject 0.5 mLs into the skin as needed. May repeat in 2 hours. MAX 2/24hours 08/23/16 05/06/19 Yes [provider]  predniSONE (DELTASONE) 20 MG tablet Take 2 tablets (40 mg total) by mouth daily with breakfast. For the next four days Patient not taking: Reported on 05/06/2019 09/23/16   Gerhard Munch, MD  tetrahydrozoline-zinc (VISINE-AC) 0.05-0.25 % ophthalmic solution Place 1 drop into both eyes daily.    [provider]  verapamil (CALAN) 120 MG tablet Take 120 mg by mouth 3 (three) times daily. 08/10/16 08/10/17  [provider]   Social History   Socioeconomic History  . Marital status: Married    Spouse name: Not on file  . Number of children: Not on file  . Years of education: Not on file  . Highest education level: Not on file  Occupational History  . Not on file  Tobacco Use  . Smoking status: Current Every Day Smoker  .  Smokeless tobacco: Current User  Substance and Sexual Activity  . Alcohol use: No  . Drug use: No  . Sexual activity: Not on file  Other Topics Concern  . Not on file  Social History Narrative  . Not on file   Social Determinants of Health   Financial Resource Strain:   . Difficulty of Paying Living Expenses: Not on file  Food Insecurity:   . Worried About Programme researcher, broadcasting/film/video in the Last Year: Not on file  . Ran Out of Food in the Last Year: Not on file  Transportation Needs:   . Lack of Transportation (Medical): Not on file  . Lack of Transportation  (Non-Medical): Not on file  Physical Activity:   . Days of Exercise per Week: Not on file  . Minutes of Exercise per Session: Not on file  Stress:   . Feeling of Stress : Not on file  Social Connections:   . Frequency of Communication with Friends and Family: Not on file  . Frequency of Social Gatherings with Friends and Family: Not on file  . Attends Religious Services: Not on file  . Active Member of Clubs or Organizations: Not on file  . Attends Banker Meetings: Not on file  . Marital Status: Not on file  Intimate Partner Violence:   . Fear of Current or Ex-Partner: Not on file  . Emotionally Abused: Not on file  . Physically Abused: Not on file  . Sexually Abused: Not on file    Review of Systems   Objective:   Vitals:   05/06/19 1516  BP: 126/85  Pulse: 74  Temp: 98.3 F (36.8 C)  TempSrc: Temporal  SpO2: 98%  Weight: 192 lb 3.2 oz (87.2 kg)  Height:  (1.778 m)     Physical Exam Vitals reviewed.  Constitutional:      Appearance: He is well-developed.  HENT:     Head: Normocephalic and atraumatic.     Right Ear: External ear normal.     Left Ear: External ear normal.  Eyes:     Conjunctiva/sclera: Conjunctivae normal.     Pupils: Pupils are equal, round, and reactive to light.  Neck:     Thyroid: No thyromegaly.  Cardiovascular:     Rate and Rhythm: Normal rate and regular rhythm.     Heart sounds: Normal heart sounds.  Pulmonary:     Effort: Pulmonary effort is normal. No respiratory distress.     Breath sounds: Normal breath sounds. No wheezing.  Abdominal:     General: There is no distension.     Palpations: Abdomen is soft.     Tenderness: There is no abdominal tenderness.  Musculoskeletal:        General: No tenderness. Normal range of motion.     Cervical back: Normal range of motion and neck supple.  Lymphadenopathy:     Cervical: No cervical adenopathy.  Skin:    General: Skin is warm and dry.  Neurological:      Mental Status: He is alert and oriented to person, place, and time.     Deep Tendon Reflexes: Reflexes are normal and symmetric.  Psychiatric:        Behavior: Behavior normal.     Assessment & Plan:  John Webb is a 43 y.o. male . Annual physical exam  - -anticipatory guidance as below in AVS, screening labs above. Health maintenance items as above in HPI discussed/recommended as applicable.   -Follow-up with headache specialist  as planned for chronic headache disorder.  Doing well recently.  Screening, lipid - Plan: Lipid panel  Screening for thyroid disorder - Plan: TSH  Screening for diabetes mellitus - Plan: Comprehensive metabolic panel  Screening, anemia, deficiency, iron - Plan: CBC  Other tobacco product nicotine dependence, uncomplicated  -Importance of cessation discussed.  Medications briefly discussed if needed.  Handout provided.  No orders of the defined types were placed in this encounter.  Patient Instructions   I'm glad to hear that headaches are doing ok. Keep follow up with headache specialist.   Let me know if any assistance needed to quit smoking.   1 year follow up physical unless any concerns sooner. Nice meeting you today and happy early birthday.   Steps to Quit Smoking Smoking tobacco is the leading cause of preventable death. It can affect almost every organ in the body. Smoking puts you and those around you at risk for developing many serious chronic diseases. Quitting smoking can be difficult, but it is one of the best things that you can do for your health. It is never too late to quit. How do I get ready to quit? When you decide to quit smoking, create a plan to help you succeed. Before you quit:  Pick a date to quit. Set a date within the next 2 weeks to give you time to prepare.  Write down the reasons why you are quitting. Keep this list in places where you will see it often.  Tell your family, friends, and co-workers that you are  quitting. Support from your loved ones can make quitting easier.  Talk with your health care provider about your options for quitting smoking.  Find out what treatment options are covered by your health insurance.  Identify people, places, things, and activities that make you want to smoke (triggers). Avoid them. What first steps can I take to quit smoking?  Throw away all cigarettes at home, at work, and in your car.  Throw away smoking accessories, such as Scientist, research (medical).  Clean your car. Make sure to empty the ashtray.  Clean your home, including curtains and carpets. What strategies can I use to quit smoking? Talk with your health care provider about combining strategies, such as taking medicines while you are also receiving in-person counseling. Using these two strategies together makes you more likely to succeed in quitting than if you used either strategy on its own.  If you are pregnant or breastfeeding, talk with your health care provider about finding counseling or other support strategies to quit smoking. Do not take medicine to help you quit smoking unless your health care provider tells you to do so. To quit smoking: Quit right away  Quit smoking completely, instead of gradually reducing how much you smoke over a period of time. Research shows that stopping smoking right away is more successful than gradually quitting.  Attend in-person counseling to help you build problem-solving skills. You are more likely to succeed in quitting if you attend counseling sessions regularly. Even short sessions of 10 minutes can be effective. Take medicine You may take medicines to help you quit smoking. Some medicines require a prescription and some you can purchase over-the-counter. Medicines may have nicotine in them to replace the nicotine in cigarettes. Medicines may:  Help to stop cravings.  Help to relieve withdrawal symptoms. Your health care provider may recommend:   Nicotine patches, gum, or lozenges.  Nicotine inhalers or sprays.  Non-nicotine medicine that is taken  by mouth. Find resources Find resources and support systems that can help you to quit smoking and remain smoke-free after you quit. These resources are most helpful when you use them often. They include:  Online chats with a Veterinary surgeon.  Telephone quitlines.  Printed Materials engineer.  Support groups or group counseling.  Text messaging programs.  Mobile phone apps or applications. Use apps that can help you stick to your quit plan by providing reminders, tips, and encouragement. There are many free apps for mobile devices as well as websites. Examples include Quit Guide from the [employer] and smokefree.gov What things can I do to make it easier to quit?   Reach out to your family and friends for support and encouragement. Call telephone quitlines (1-800-QUIT-NOW), reach out to support groups, or work with a counselor for support.  Ask people who smoke to avoid smoking around you.  Avoid places that trigger you to smoke, such as bars, parties, or smoke-break areas at work.  Spend time with people who do not smoke.  Lessen the stress in your life. Stress can be a smoking trigger for some people. To lessen stress, try: ? Exercising regularly. ? Doing deep-breathing exercises. ? Doing yoga. ? Meditating. ? Performing a body scan. This involves closing your eyes, scanning your body from head to toe, and noticing which parts of your body are particularly tense. Try to relax the muscles in those areas. How will I feel when I quit smoking? Day 1 to 3 weeks Within the first 24 hours of quitting smoking, you may start to feel withdrawal symptoms. These symptoms are usually most noticeable 2-3 days after quitting, but they usually do not last for more than 2-3 weeks. You may experience these symptoms:  Mood swings.  Restlessness, anxiety, or irritability.  Trouble concentrating.   Dizziness.  Strong cravings for sugary foods and nicotine.  Mild weight gain.  Constipation.  Nausea.  Coughing or a sore throat.  Changes in how the medicines that you take for unrelated issues work in your body.  Depression.  Trouble sleeping (insomnia). Week 3 and afterward After the first 2-3 weeks of quitting, you may start to notice more positive results, such as:  Improved sense of smell and taste.  Decreased coughing and sore throat.  Slower heart rate.  Lower blood pressure.  Clearer skin.  The ability to breathe more easily.  Fewer sick days. Quitting smoking can be very challenging. Do not get discouraged if you are not successful the first time. Some people need to make many attempts to quit before they achieve long-term success. Do your best to stick to your quit plan, and talk with your health care provider if you have any questions or concerns. Summary  Smoking tobacco is the leading cause of preventable death. Quitting smoking is one of the best things that you can do for your health.  When you decide to quit smoking, create a plan to help you succeed.  Quit smoking right away, not slowly over a period of time.  When you start quitting, seek help from your health care provider, family, or friends. This information is not intended to replace advice given to you by your health care provider. Make sure you discuss any questions you have with your health care provider. Document Released: 05/03/2001 Document Revised: 07/27/2018 Document Reviewed: 07/28/2018 Elsevier Patient Education  2020 [employer].    If you have lab work done today you will be contacted with your lab results within the next  2 weeks.  If you have not heard from us then please contact us. The fastest way to get your results is to register for My Chart.   IF you received an x-ray today, you will receive an invoice from [facility]. Please contact [facility] at  [phone] with questions or concerns regarding your invoice.   IF you received labwork today, you will receive an invoice from [facility]. Please contact [facility] at [phone] with questions or concerns regarding your invoice.   Our billing staff will not be able to assist you with questions regarding bills from these companies.  You will be contacted with the lab results as soon as they are available. The fastest way to get your results is to activate your My Chart account. Instructions are located on the last page of this paperwork. If you have not heard from us regarding the results in 2 weeks, please contact this office.         Signed, Meredith StaggersJeffrey Ason Heslin, MD [facility]

## 2019-05-07 LAB — COMPREHENSIVE METABOLIC PANEL
ALT: 19 IU/L (ref 0–44)
AST: 21 IU/L (ref 0–40)
Albumin/Globulin Ratio: 1.7 (ref 1.2–2.2)
Albumin: 4 g/dL (ref 4.0–5.0)
Alkaline Phosphatase: 71 IU/L (ref 39–117)
BUN/Creatinine Ratio: 8 — ABNORMAL LOW (ref 9–20)
BUN: 11 mg/dL (ref 6–24)
Bilirubin Total: 0.5 mg/dL (ref 0.0–1.2)
CO2: 27 mmol/L (ref 20–29)
Calcium: 9.2 mg/dL (ref 8.7–10.2)
Chloride: 102 mmol/L (ref 96–106)
Creatinine, Ser: 1.36 mg/dL — ABNORMAL HIGH (ref 0.76–1.27)
GFR calc Af Amer: 74 mL/min/{1.73_m2} (ref >59)
GFR calc non Af Amer: 64 mL/min/{1.73_m2} (ref >59)
Globulin, Total: 2.4 g/dL (ref 1.5–4.5)
Glucose: 91 mg/dL (ref 65–99)
Potassium: 4.1 mmol/L (ref 3.5–5.2)
Sodium: 138 mmol/L (ref 134–144)
Total Protein: 6.4 g/dL (ref 6.0–8.5)

## 2019-05-07 LAB — CBC
Hematocrit: 45.6 % (ref 37.5–51.0)
Hemoglobin: 15.4 g/dL (ref 13.0–17.7)
MCH: 30.1 pg (ref 26.6–33.0)
MCHC: 33.8 g/dL (ref 31.5–35.7)
MCV: 89 fL (ref 79–97)
Platelets: 235 10*3/uL (ref 150–450)
RBC: 5.12 x10E6/uL (ref 4.14–5.80)
RDW: 13.6 % (ref 11.6–15.4)
WBC: 4.8 10*3/uL (ref 3.4–10.8)

## 2019-05-07 LAB — LIPID PANEL
Chol/HDL Ratio: 2.5 ratio (ref 0.0–5.0)
Cholesterol, Total: 178 mg/dL (ref 100–199)
HDL: 71 mg/dL (ref >39)
LDL Chol Calc (NIH): 89 mg/dL (ref 0–99)
Triglycerides: 99 mg/dL (ref 0–149)
VLDL Cholesterol Cal: 18 mg/dL (ref 5–40)

## 2019-05-07 LAB — TSH: TSH: 1.57 u[IU]/mL (ref 0.450–4.500)

## 2019-05-08 ENCOUNTER — Telehealth: Payer: Self-pay | Admitting: Family Medicine

## 2019-05-08 NOTE — Telephone Encounter (Signed)
Patient was informed Dr have not review labs yet. We will contact him once reviewed

## 2019-05-08 NOTE — Telephone Encounter (Signed)
Pt called to ask if results have come in and if so   Please advise

## 2019-05-09 NOTE — Telephone Encounter (Signed)
Pt checking on status of this message.  °

## 2019-05-13 NOTE — Telephone Encounter (Signed)
Pt called asking for his lab results. Informed pt that they have not been reviewed and that it may take up to 2 weeks

## 2019-05-14 DIAGNOSIS — F4321 Adjustment disorder with depressed mood: Secondary | ICD-10-CM | POA: Diagnosis not present

## 2019-05-20 ENCOUNTER — Telehealth: Payer: Self-pay | Admitting: Family Medicine

## 2019-05-20 NOTE — Telephone Encounter (Signed)
Unable to reach patient. No machine to leave a msg. If paitent return call please inform him the lab results and a lab letter has been mailed

## 2019-05-20 NOTE — Telephone Encounter (Signed)
Spoke to paitent he was informed of lab result and a lab letter has been mailed

## 2019-05-20 NOTE — Telephone Encounter (Signed)
Pt called to get update on his results, He says that he has not been contacted to speak in regards to them , He is upset and states that he is disappointed.   Please advise

## 2019-05-21 DIAGNOSIS — F4321 Adjustment disorder with depressed mood: Secondary | ICD-10-CM | POA: Diagnosis not present

## 2019-05-27 DIAGNOSIS — F4321 Adjustment disorder with depressed mood: Secondary | ICD-10-CM | POA: Diagnosis not present

## 2019-05-29 DIAGNOSIS — M545 Low back pain: Secondary | ICD-10-CM | POA: Diagnosis not present

## 2019-05-29 DIAGNOSIS — R944 Abnormal results of kidney function studies: Secondary | ICD-10-CM | POA: Diagnosis not present

## 2019-05-30 ENCOUNTER — Ambulatory Visit: Payer: BC Managed Care – PPO | Admitting: Family Medicine

## 2019-06-11 DIAGNOSIS — F4321 Adjustment disorder with depressed mood: Secondary | ICD-10-CM | POA: Diagnosis not present

## 2019-06-20 DIAGNOSIS — F4321 Adjustment disorder with depressed mood: Secondary | ICD-10-CM | POA: Diagnosis not present

## 2019-06-25 DIAGNOSIS — F419 Anxiety disorder, unspecified: Secondary | ICD-10-CM | POA: Diagnosis not present

## 2019-06-25 DIAGNOSIS — G43009 Migraine without aura, not intractable, without status migrainosus: Secondary | ICD-10-CM | POA: Diagnosis not present

## 2019-06-25 DIAGNOSIS — G44009 Cluster headache syndrome, unspecified, not intractable: Secondary | ICD-10-CM | POA: Diagnosis not present

## 2019-07-02 DIAGNOSIS — F4321 Adjustment disorder with depressed mood: Secondary | ICD-10-CM | POA: Diagnosis not present

## 2019-07-16 DIAGNOSIS — F4321 Adjustment disorder with depressed mood: Secondary | ICD-10-CM | POA: Diagnosis not present

## 2019-08-02 DIAGNOSIS — M5432 Sciatica, left side: Secondary | ICD-10-CM | POA: Diagnosis not present

## 2019-09-10 DIAGNOSIS — F4321 Adjustment disorder with depressed mood: Secondary | ICD-10-CM | POA: Diagnosis not present

## 2019-10-01 DIAGNOSIS — F4321 Adjustment disorder with depressed mood: Secondary | ICD-10-CM | POA: Diagnosis not present

## 2019-10-24 DIAGNOSIS — F4321 Adjustment disorder with depressed mood: Secondary | ICD-10-CM | POA: Diagnosis not present

## 2019-12-09 DIAGNOSIS — F4321 Adjustment disorder with depressed mood: Secondary | ICD-10-CM | POA: Diagnosis not present

## 2020-02-03 DIAGNOSIS — F4321 Adjustment disorder with depressed mood: Secondary | ICD-10-CM | POA: Diagnosis not present

## 2020-02-24 DIAGNOSIS — F4321 Adjustment disorder with depressed mood: Secondary | ICD-10-CM | POA: Diagnosis not present

## 2021-06-21 DIAGNOSIS — M25512 Pain in left shoulder: Secondary | ICD-10-CM | POA: Diagnosis not present

## 2021-11-03 DIAGNOSIS — G44009 Cluster headache syndrome, unspecified, not intractable: Secondary | ICD-10-CM | POA: Diagnosis not present
